# Patient Record
Sex: Male | Born: 1935 | Race: White | Hispanic: No | Marital: Married
Health system: Southern US, Community
[De-identification: ages and names within clinical notes are randomized; demographics above are authoritative.]

## PROBLEM LIST (undated history)

## (undated) DIAGNOSIS — I639 Cerebral infarction, unspecified: Secondary | ICD-10-CM

## (undated) DIAGNOSIS — F32A Depression, unspecified: Secondary | ICD-10-CM

## (undated) DIAGNOSIS — F329 Major depressive disorder, single episode, unspecified: Secondary | ICD-10-CM

## (undated) HISTORY — PX: HERNIA REPAIR: SHX51

## (undated) HISTORY — PX: CHOLECYSTECTOMY: SHX55

---

## 1983-03-18 HISTORY — PX: ANKLE FRACTURE SURGERY: SHX122

## 2016-08-19 ENCOUNTER — Emergency Department (HOSPITAL_COMMUNITY): Payer: Medicare Other

## 2016-08-19 ENCOUNTER — Inpatient Hospital Stay (HOSPITAL_COMMUNITY)
Admission: EM | Admit: 2016-08-19 | Discharge: 2016-08-20 | DRG: 065 | Disposition: A | Discharge status: Home / Self Care | Payer: Medicare Other | Attending: Student in an Organized Health Care Education/Training Program | Admitting: Student in an Organized Health Care Education/Training Program

## 2016-08-19 ENCOUNTER — Observation Stay (HOSPITAL_COMMUNITY): Payer: Medicare Other

## 2016-08-19 ENCOUNTER — Inpatient Hospital Stay (HOSPITAL_COMMUNITY): Payer: Medicare Other

## 2016-08-19 ENCOUNTER — Encounter (HOSPITAL_COMMUNITY): Payer: Self-pay | Admitting: Emergency Medicine

## 2016-08-19 DIAGNOSIS — R29702 NIHSS score 2: Secondary | ICD-10-CM | POA: Diagnosis present

## 2016-08-19 DIAGNOSIS — I480 Paroxysmal atrial fibrillation: Secondary | ICD-10-CM | POA: Diagnosis present

## 2016-08-19 DIAGNOSIS — Z79899 Other long term (current) drug therapy: Secondary | ICD-10-CM | POA: Diagnosis not present

## 2016-08-19 DIAGNOSIS — E785 Hyperlipidemia, unspecified: Secondary | ICD-10-CM

## 2016-08-19 DIAGNOSIS — I63411 Cerebral infarction due to embolism of right middle cerebral artery: Principal | ICD-10-CM | POA: Diagnosis present

## 2016-08-19 DIAGNOSIS — Z7982 Long term (current) use of aspirin: Secondary | ICD-10-CM | POA: Diagnosis not present

## 2016-08-19 DIAGNOSIS — G8194 Hemiplegia, unspecified affecting left nondominant side: Secondary | ICD-10-CM | POA: Diagnosis present

## 2016-08-19 DIAGNOSIS — I63511 Cerebral infarction due to unspecified occlusion or stenosis of right middle cerebral artery: Secondary | ICD-10-CM | POA: Diagnosis not present

## 2016-08-19 DIAGNOSIS — I639 Cerebral infarction, unspecified: Secondary | ICD-10-CM | POA: Diagnosis present

## 2016-08-19 DIAGNOSIS — G458 Other transient cerebral ischemic attacks and related syndromes: Secondary | ICD-10-CM

## 2016-08-19 DIAGNOSIS — Z823 Family history of stroke: Secondary | ICD-10-CM | POA: Diagnosis not present

## 2016-08-19 DIAGNOSIS — F329 Major depressive disorder, single episode, unspecified: Secondary | ICD-10-CM | POA: Diagnosis present

## 2016-08-19 DIAGNOSIS — G459 Transient cerebral ischemic attack, unspecified: Secondary | ICD-10-CM

## 2016-08-19 DIAGNOSIS — R531 Weakness: Secondary | ICD-10-CM | POA: Diagnosis present

## 2016-08-19 DIAGNOSIS — Z9049 Acquired absence of other specified parts of digestive tract: Secondary | ICD-10-CM

## 2016-08-19 DIAGNOSIS — F32A Depression, unspecified: Secondary | ICD-10-CM | POA: Diagnosis present

## 2016-08-19 HISTORY — DX: Major depressive disorder, single episode, unspecified: F32.9

## 2016-08-19 HISTORY — DX: Cerebral infarction, unspecified: I63.9

## 2016-08-19 HISTORY — DX: Depression, unspecified: F32.A

## 2016-08-19 LAB — COMPREHENSIVE METABOLIC PANEL
ALBUMIN: 3.4 g/dL — AB (ref 3.5–5.0)
ALT: 17 U/L (ref 17–63)
ANION GAP: 11 (ref 5–15)
AST: 20 U/L (ref 15–41)
Alkaline Phosphatase: 70 U/L (ref 38–126)
BUN: 21 mg/dL — ABNORMAL HIGH (ref 6–20)
CHLORIDE: 104 mmol/L (ref 101–111)
CO2: 23 mmol/L (ref 22–32)
Calcium: 8.7 mg/dL — ABNORMAL LOW (ref 8.9–10.3)
Creatinine, Ser: 1.16 mg/dL (ref 0.61–1.24)
GFR calc non Af Amer: 58 mL/min — ABNORMAL LOW (ref >60)
GLUCOSE: 135 mg/dL — AB (ref 65–99)
POTASSIUM: 4 mmol/L (ref 3.5–5.1)
SODIUM: 138 mmol/L (ref 135–145)
Total Bilirubin: 0.7 mg/dL (ref 0.3–1.2)
Total Protein: 6.7 g/dL (ref 6.5–8.1)

## 2016-08-19 LAB — I-STAT TROPONIN, ED: Troponin i, poc: 0 ng/mL (ref 0.00–0.08)

## 2016-08-19 LAB — I-STAT CHEM 8, ED
BUN: 23 mg/dL — AB (ref 6–20)
CHLORIDE: 104 mmol/L (ref 101–111)
Calcium, Ion: 1.13 mmol/L — ABNORMAL LOW (ref 1.15–1.40)
Creatinine, Ser: 1.2 mg/dL (ref 0.61–1.24)
Glucose, Bld: 132 mg/dL — ABNORMAL HIGH (ref 65–99)
HEMATOCRIT: 43 % (ref 39.0–52.0)
Hemoglobin: 14.6 g/dL (ref 13.0–17.0)
Potassium: 3.9 mmol/L (ref 3.5–5.1)
Sodium: 141 mmol/L (ref 135–145)
TCO2: 25 mmol/L (ref 0–100)

## 2016-08-19 LAB — CBC
HCT: 42.7 % (ref 39.0–52.0)
Hemoglobin: 14.3 g/dL (ref 13.0–17.0)
MCH: 30.9 pg (ref 26.0–34.0)
MCHC: 33.5 g/dL (ref 30.0–36.0)
MCV: 92.2 fL (ref 78.0–100.0)
PLATELETS: 208 10*3/uL (ref 150–400)
RBC: 4.63 MIL/uL (ref 4.22–5.81)
RDW: 13.2 % (ref 11.5–15.5)
WBC: 8.6 10*3/uL (ref 4.0–10.5)

## 2016-08-19 LAB — DIFFERENTIAL
BASOS PCT: 0 %
Basophils Absolute: 0 10*3/uL (ref 0.0–0.1)
EOS ABS: 0.2 10*3/uL (ref 0.0–0.7)
Eosinophils Relative: 2 %
Lymphocytes Relative: 33 %
Lymphs Abs: 2.9 10*3/uL (ref 0.7–4.0)
Monocytes Absolute: 0.7 10*3/uL (ref 0.1–1.0)
Monocytes Relative: 8 %
NEUTROS PCT: 57 %
Neutro Abs: 4.9 10*3/uL (ref 1.7–7.7)

## 2016-08-19 LAB — PROTIME-INR
INR: 1.13
PROTHROMBIN TIME: 14.6 s (ref 11.4–15.2)

## 2016-08-19 LAB — ECHOCARDIOGRAM COMPLETE: WEIGHTICAEL: 3724.89 [oz_av]

## 2016-08-19 LAB — APTT: aPTT: 27 seconds (ref 24–36)

## 2016-08-19 MED ORDER — ACETAMINOPHEN 650 MG RE SUPP
650.0000 mg | RECTAL | Status: DC | PRN
Start: 2016-08-19 — End: 2016-08-20

## 2016-08-19 MED ORDER — ENOXAPARIN SODIUM 40 MG/0.4ML ~~LOC~~ SOLN
40.0000 mg | SUBCUTANEOUS | Status: DC
  Administered 2016-08-19 – 2016-08-20 (×2): 40 mg via SUBCUTANEOUS
  Filled 2016-08-19 (×2): qty 0.4

## 2016-08-19 MED ORDER — STROKE: EARLY STAGES OF RECOVERY BOOK
Freq: Once | Status: AC
  Administered 2016-08-19: 12:00:00

## 2016-08-19 MED ORDER — ACETAMINOPHEN 160 MG/5ML PO SOLN
650.0000 mg | ORAL | Status: DC | PRN
Start: 2016-08-19 — End: 2016-08-20

## 2016-08-19 MED ORDER — CITALOPRAM HYDROBROMIDE 10 MG PO TABS
10.0000 mg | ORAL_TABLET | Freq: Every day | ORAL | Status: DC
  Administered 2016-08-19 – 2016-08-20 (×2): 10 mg via ORAL
  Filled 2016-08-19 (×2): qty 1

## 2016-08-19 MED ORDER — ATORVASTATIN CALCIUM 40 MG PO TABS
40.0000 mg | ORAL_TABLET | Freq: Every day | ORAL | Status: DC
  Administered 2016-08-19: 40 mg via ORAL
  Filled 2016-08-19: qty 1

## 2016-08-19 MED ORDER — ASPIRIN EC 325 MG PO TBEC
325.0000 mg | DELAYED_RELEASE_TABLET | Freq: Every day | ORAL | Status: DC
  Administered 2016-08-19 – 2016-08-20 (×2): 325 mg via ORAL
  Filled 2016-08-19 (×2): qty 1

## 2016-08-19 MED ORDER — VITAMIN B-12 1000 MCG PO TABS
1000.0000 ug | ORAL_TABLET | Freq: Every day | ORAL | Status: DC
  Administered 2016-08-19 – 2016-08-20 (×2): 1000 ug via ORAL
  Filled 2016-08-19 (×2): qty 1

## 2016-08-19 MED ORDER — ACETAMINOPHEN 325 MG PO TABS: 650.0000 mg | ORAL_TABLET | ORAL | Status: DC | PRN

## 2016-08-19 NOTE — Progress Notes (Signed)
Patient admitted to 205m15. Patient is alert, oriented x4, NIHHS 0, skin is intact, Q2 vitals started. Patient c/o 2/10 headache, refuses medication at this time.

## 2016-08-19 NOTE — Discharge Planning (Signed)
[facility] Alda LeaKelly Ritchie, Consulting civil engineerTranfer Coordinator [facility].  [facility] to [facility] with [facility]. EDCM handed off VA transfer paperwork to 57M CM, Fabio NeighborsKelli Willard.

## 2016-08-19 NOTE — ED Triage Notes (Addendum)
Per EMS, pt from home with c/o BLE weakness. LKW at 0100, pt attempted to get up at 0500 and was unable to stand. Pt stated he did not fall, pt also states he felt like his speech was different, speech clear at this time. VSS

## 2016-08-19 NOTE — Consult Note (Signed)
Neurology Consultation Reason for Consult: Difficulty walking Referring Physician: Bebe Shaggy, D  CC: Weakness  History is obtained from: Patient, wife  HPI: Adonus Uselman is a 83 y.o. male who presents with difficulty walking. He states that he went to go get out of bed and just was unable to arise. He got numb sometime overnight in the bathroom, sometime between 1 and 2 AM. He is not sure of the time. His wife states that she is also on not certain, but it was probably "between 1 and 2 AM." He then awoke around 5 and tried to get out of bed. He was unable to, feeling just generally weak. [facility] apartment was called to found him half and then half out of bed and EMS was called and transported him as a code stroke.   LKW: 1 AM tpa given?: no, out of window   ROS: A 14 point ROS was performed and is negative except as noted in the HPI.   Past medical history: Depression(on Celexa)  Family history: No history of stroke  Social History:  reports that he is a non-smoker but has been exposed to tobacco smoke. He has never used smokeless tobacco. He reports that he does not drink alcohol or use drugs.   Exam: Current vital signs: BP (!) 154/82 (BP Location: Right Arm)   Pulse 68   Temp 97.5 F (36.4 C) (Oral)   Resp 15   Wt 105.6 kg (232 lb 12.9 oz)   SpO2 96%  Vital signs in last 24 hours: Temp:  [97.5 F (36.4 C)] 97.5 F (36.4 C) (06/05 0603) Pulse Rate:  [68] 68 (06/05 0603) Resp:  [15] 15 (06/05 0603) BP: (154)/(82) 154/82 (06/05 0603) SpO2:  [96 %] 96 % (06/05 0603) Weight:  [105.6 kg (232 lb 12.9 oz)] 105.6 kg (232 lb 12.9 oz) (06/05 0603)   Physical Exam  Constitutional: Appears well-developed and well-nourished.  Psych: Affect appropriate to situation Eyes: No scleral injection HENT: No OP obstrucion Head: Normocephalic.  Cardiovascular: Normal rate and regular rhythm.  Respiratory: Effort normal and breath sounds normal to anterior ascultation GI: Soft.  No  distension. There is no tenderness.  Skin: WDI  Neuro: Mental Status: Patient is awake, alert, oriented to person, place, month, year, and situation. Patient is able to give a clear and coherent history. No signs of aphasia. He has clear extinction to double simultaneous stimulation to light touch, but not to visual stimuli Cranial Nerves: II: Visual Fields are full. Pupils are equal, round, and reactive to light.   III,IV, VI: EOMI without ptosis or diploplia.  V: Facial sensation is symmetric to temperature VII: Facial movement is symmetric.  VIII: hearing is intact to voice X: Uvula elevates symmetrically XI: Shoulder shrug is symmetric. XII: tongue is midline without atrophy or fasciculations.  Motor: Tone is normal. Bulk is normal. 5/5 strength was present in all four extremities.  Sensory: He is unable to feel touch in the distal left leg, the proximal left leg he is able to identify touch, but with double simultaneous stimulation in both the leg, arm, and face he extinguishes. Cerebellar: FNF and HKS are intact bilaterally   I have reviewed labs in epic and the results pertinent to this consultation are: CMP-unremarkable  I have reviewed the images obtained: CT head-unremarkable  Impression: 83 year old male with new onset neglect. I suspect that he may have had more severe weakness earlier but was unaware of laterality due to his neglect. Unfortunately, he is outside of  the IV TPA window, but further evaluation is needed.  Recommendations: 1. HgbA1c, fasting lipid panel 2. MRI, MRA  of the brain without contrast 3. Frequent neuro checks 4. Echocardiogram 5. Carotid dopplers 6. Prophylactic therapy-Antiplatelet med: Aspirin - dose 325mg  PO or 300mg  PR 7. Risk factor modification 8. Telemetry monitoring 9. PT consult, OT consult, Speech consult 10. please page stroke NP  Or  PA  Or MD  from 8am -4 pm as this patient will be followed by the stroke team at this point.    You can look them up on [email or web address]      Ritta SlotMcNeill Kahle Mcqueen, MD [facility] [phone]  If 7pm- 7am, please page neurology on call as listed in AMION.

## 2016-08-19 NOTE — Evaluation (Signed)
Physical Therapy Evaluation Patient Details Name: Robert Mendoza MRN: [id number] DOB: 08-29-1933 Today's Date: 08/19/2016   History of Present Illness  Patient is an 83 yo male admitted 08/19/16 with Lt-sided weakness, LT neglect, decreased ambulation.  Per chart, patient with quick improvements.  MRI report shows Rt MCA distribution infarct.     PMH:  PAF, cholecystectomy, depression   Clinical Impression  Patient is functioning at supervision to Mod I level with mobility and gait.  Patient reports he is at baseline with gait.  No further acute PT needs identified - PT will sign off.    Follow Up Recommendations No PT follow up;Supervision for mobility/OOB    Equipment Recommendations  None recommended by PT    Recommendations for Other Services       Precautions / Restrictions Precautions Precautions: None Restrictions Weight Bearing Restrictions: No      Mobility  Bed Mobility Overal bed mobility: Modified Independent                Transfers Overall transfer level: Modified independent Equipment used: None                Ambulation/Gait Ambulation/Gait assistance: Supervision Ambulation Distance (Feet): 180 Feet Assistive device: None Gait Pattern/deviations: Step-through pattern;Decreased stride length;Wide base of support     General Gait Details: Patient ambulates with no assistive device with good balance.  Wide base of support - knee instability.  No loss of balance during gait.  Stairs            Wheelchair Mobility    Modified Rankin (Stroke Patients Only) Modified Rankin (Stroke Patients Only) Pre-Morbid Rankin Score: No symptoms Modified Rankin: No significant disability     Balance Overall balance assessment: Needs assistance         Standing balance support: No upper extremity supported Standing balance-Leahy Scale: Good                               Pertinent Vitals/Pain Pain Assessment: 0-10 Pain Score: 3   Pain Location: Head Pain Descriptors / Indicators: Headache;Dull Pain Intervention(s): Monitored during session    Home Living Family/patient expects to be discharged to:: Private residence Living Arrangements: Spouse/significant other Available Help at Discharge: Family;Available 24 hours/day (Supervision assist only) Type of Home: House Home Access: Level entry     Home Layout: One level Home Equipment: Cane - single point;Shower seat Additional Comments: Wife is having trouble with Lt leg/hip and can provide supervision assist.    Prior Function Level of Independence: Independent         Comments: Patient worked in his garden, drives     Higher education careers adviser   Dominant Hand: Right    Extremity/Trunk Assessment   Upper Extremity Assessment Upper Extremity Assessment: Defer to OT evaluation    Lower Extremity Assessment Lower Extremity Assessment: Overall WFL for tasks assessed       Communication   Communication: No difficulties  Cognition Arousal/Alertness: Awake/alert Behavior During Therapy: WFL for tasks assessed/performed Overall Cognitive Status: Within Functional Limits for tasks assessed                                        General Comments      Exercises     Assessment/Plan    PT Assessment Patent does not need any further PT services  PT  Problem List         PT Treatment Interventions      PT Goals (Current goals can be found in the Care Plan section)  Acute Rehab PT Goals PT Goal Formulation: All assessment and education complete, DC therapy    Frequency     Barriers to discharge        Co-evaluation               AM-PAC PT "6 Clicks" Daily Activity  Outcome Measure Difficulty turning over in bed (including adjusting bedclothes, sheets and blankets)?: None Difficulty moving from lying on back to sitting on the side of the bed? : None Difficulty sitting down on and standing up from a chair with arms (e.g.,  wheelchair, bedside commode, etc,.)?: None Help needed moving to and from a bed to chair (including a wheelchair)?: None Help needed walking in hospital room?: A Little Help needed climbing 3-5 steps with a railing? : A Little 6 Click Score: 22    End of Session Equipment Utilized During Treatment: Gait belt Activity Tolerance: Patient tolerated treatment well Patient left: in bed;with call bell/phone within reach;with family/visitor present (sitting EOB for dinner) Nurse Communication: Mobility status (No PT needs, will sign off. Encouraged ambulation) PT Visit Diagnosis: Other abnormalities of gait and mobility (R26.89);Pain Pain - part of body:  (Headache)    Time: [id number] PT Time Calculation (min) (ACUTE ONLY): 25 min   Charges:   PT Evaluation $PT Eval Moderate Complexity: 1 Procedure PT Treatments $Gait Training: 8-22 mins   PT G Codes:        Durenda HurtSusan H. Renaldo Fiddleravis, PT, [facility] Acute Rehab Services Pager [phone]   Vena AustriaSusan H Maudene Stotler 08/19/2016, 8:35 PM

## 2016-08-19 NOTE — ED Notes (Signed)
Patient transported to MRI 

## 2016-08-19 NOTE — H&P (Signed)
Date: 08/19/2016               Patient Name:  Robert Mendoza MRN: [id number]  DOB: 1934-01-01 Age / Sex: 83 y.o., male   PCP: Robert Apley, MD         Medical Service: Internal Medicine Teaching Service         Attending Physician: Dr. Oswaldo Mendoza, Robert Mendoza, *    First Contact: Dr. Ladona Mendoza Pager: [phone]  Second Contact: Dr. Allena Mendoza Pager: [phone]       After Hours (After 5p/  First Contact Pager: [phone]  weekends / holidays): Second Contact Pager: [phone]   Chief Complaint: difficulty speaking, getting out of bed  History of Present Illness: Robert Mendoza is an 83 year old man with h/o cholecystectomy, depression who presented to the ED this morning with difficulty speaking and getting out of bed. His wife Robert Mendoza was at bedside and contributed to the interview.  When he awoke around 5AM, his wife noted he had trouble speaking and difficulty getting out of bed. Though he didn't fall, he was slumped in his bed, so she called EMS. His deficits improved in the ED though per EMS he had some sensory deficits. He last felt normal around midnight. Though he had a history of recurrent syncopal episodes prior to his cholecystectomy in July 2017, he recalls this morning's events. He denied any associated numbness, changes in vision, dizziness, falls, prior history of similar symptoms or TIA/CVA. [facility] and has seen a cardiologist before and after his surgery and was told he didn't have diabetes or high cholesterol. His sister had a stroke last year at the age of 75 which resulted in difficulty performing basic ADLs and now resides in an assisted living facility. At baseline, he is independent in all of his ADLs and performs garden work without difficulty. He denies any history of tobacco or illicit drug use though drinks at most 1 beer/month.  In the ED, head CT was without hemorrhage. Neurology was consulted and recommended admission for workup of TIA/CVA.   Meds:  Current Meds    Medication Sig  . aspirin EC 81 MG tablet Take 81 mg by mouth daily.  . citalopram (CELEXA) 20 MG tablet Take 10 mg by mouth daily.  . Cyanocobalamin (VITAMIN B-12 PO) Take 1 tablet by mouth daily.    Allergies: Allergies as of 08/19/2016  . (No Known Allergies)   History reviewed. No pertinent past medical history.  Family History: As noted in the HPI.  Social History: Served in the [employer] for 12 years and was stationed in [place], [place], and [place]. Worked as a Chartered certified accountant for 40 years.  Review of Systems: A complete ROS was negative except as per HPI.   Physical Exam: Blood pressure (!) 138/96, pulse (!) 58, temperature 97.5 F (36.4 C), temperature source Oral, resp. rate (!) 9, weight 232 lb 12.9 oz (105.6 kg), SpO2 97 %. Physical Exam  Constitutional: He is oriented to person, place, and time. No distress.  HENT:  Head: Normocephalic and atraumatic.  Eyes: Conjunctivae are normal. No scleral icterus.  Neck: Normal range of motion. No tracheal deviation present.  Cardiovascular: Normal rate, normal heart sounds and intact distal pulses.   Pulmonary/Chest: Effort normal. No respiratory distress.  Neurological: He is alert and oriented to person, place, and time. No cranial nerve deficit. Coordination normal.  2+ patellar reflexes bilaterally. Babinski unequivocal bilaterally.  Skin: Skin is warm and dry. He is not  diaphoretic.   EKG: I reviewed and noted normal sinus rhythm and normal axis.  -PR interval is 0.12s which is within normal limits.   Assessment & Plan by Problem: Principal Problem:   TIA (transient ischemic attack) Active Problems:   S/P cholecystectomy   Depression  Mr. Leane Mendoza is an 83 year old man with h/o cholecystectomy, depression hospitalized for TIA/CVA.  TIA/CVA: No focal deficits on exam but age and presentation are concerning for TIA or ischemic. Other possibilities include syncope though he recalls the events completely or hypoglycemia  but is not on any hypoglycemic agents with normal glucose on admission labwork.  -Admit to observation with telemetry -Check echo, carotid dopplers, A1c, lipid panel -Continue neuro checks q2h then q4h -Follow-up brain MRI/MRA -Consult PT/OT/SLP -Neurology following, appreciate recs -Give aspirin 325 mg though plan to switch to clopidogrel if he does have CVA since he was on daily aspirin beforehand  Depression: Continue citalopram 10 mg pending swallow eval.  #FEN:  -Diet: NPO pending bedside swallow  #DVT prophylaxis: Lovenox  #CODE STATUS: FULL CODE -Defer to wife Brain HiltsLisa Mendoza [phone]] if patients lacks decision-making capacity -Confirmed with patient on admission  Dispo: Admit patient to Observation with expected length of stay less than 2 midnights.  Signed: Beather Mendoza, Robert V, MD 08/19/2016, 8:55 AM  Pager: [phone]

## 2016-08-19 NOTE — ED Provider Notes (Signed)
D/w internal medicine service Will admit They will [facility]w [facility] if patient can be managed here or will need [facility] - [phone] Dr Mortimer FriesLiliana    Devi Hopman, MD 08/19/16 [id number]

## 2016-08-19 NOTE — ED Provider Notes (Signed)
[facility]-EMERGENCY DEPT Provider Note   CSN: [id number] Arrival date & time: 08/19/16  0544   An emergency department physician performed an initial assessment on this suspected stroke patient at 0546.  History   Chief Complaint Chief Complaint  Patient presents with  . Code Stroke   LEVEL 5 CAVEAT DUE TO ACUITY OF CONDITION  HPI Anastacio Bua is a 83 y.o. male.  The history is provided by the patient, the spouse and the EMS personnel. The history is limited by the condition of the patient.  Weakness  Primary symptoms include loss of sensation, loss of balance, speech change. This is a new problem. The current episode started 3 to 5 hours ago. The problem has been gradually improving. There has been no fever. Pertinent negatives include no shortness of breath and no chest pain.  pt presents with acute onset of weakness, difficulty speaking and loss of sensation LKW approximately 1am He is now improving No other acute complaints at this time He is a [facility] patient    PMH - unknown  Past Surgical History:  Procedure Laterality Date  . CHOLECYSTECTOMY         Home Medications    Prior to Admission medications   Medication Sig Start Date End Date Taking? Authorizing Provider  aspirin EC 81 MG tablet Take 81 mg by mouth daily.   Yes [provider]  citalopram (CELEXA) 20 MG tablet Take 10 mg by mouth daily.   Yes [provider]  Cyanocobalamin (VITAMIN B-12 PO) Take 1 tablet by mouth daily.   Yes [provider]    Family History No family history on file.  Social History Social History  Substance Use Topics  . Smoking status: Passive Smoke Exposure - Never Smoker  . Smokeless tobacco: Never Used  . Alcohol use No     Allergies   Patient has no known allergies.   Review of Systems Review of Systems  Unable to perform ROS: Acuity of condition  Respiratory: Negative for shortness of breath.   Cardiovascular: Negative for chest pain.    Neurological: Positive for speech change, weakness and loss of balance.     Physical Exam Updated Vital Signs BP (!) 154/82 (BP Location: Right Arm)   Pulse 68   Temp 97.5 F (36.4 C) (Oral)   Resp 15   Wt 105.6 kg (232 lb 12.9 oz)   SpO2 96%   Physical Exam CONSTITUTIONAL: Elderly, no acute distress HEAD: Normocephalic/atraumatic EYES: EOMI ENMT: Mucous membranes moist NECK: supple no meningeal signs SPINE/BACK:entire spine nontender CV: S1/S2 noted, no murmurs/rubs/gallops noted LUNGS: Lungs are clear to auscultation bilaterally, no apparent distress ABDOMEN: soft, nontender GU:no cva tenderness NEURO: Pt is awake/alert/appropriate, moves all extremitiesx4.  No facial droop.  No arm/leg drift. No speech changes.   EXTREMITIES: pulses normal/equal, full ROM SKIN: warm, color normal PSYCH: no abnormalities of mood noted, alert and oriented to situation   ED Treatments / Results  Labs (all labs ordered are listed, but only abnormal results are displayed) Labs Reviewed  COMPREHENSIVE METABOLIC PANEL - Abnormal; Notable for the following:       Result Value   Glucose, Bld 135 (*)    BUN 21 (*)    Calcium 8.7 (*)    Albumin 3.4 (*)    GFR calc non Af Amer 58 (*)    All other components within normal limits  I-STAT CHEM 8, ED - Abnormal; Notable for the following:    BUN 23 (*)  Glucose, Bld 132 (*)    Calcium, Ion 1.13 (*)    All other components within normal limits  PROTIME-INR  APTT  CBC  DIFFERENTIAL  I-STAT TROPOININ, ED  CBG MONITORING, ED    EKG  EKG Interpretation  Date/Time:  Tuesday August 19 2016 06:01:33 EDT Ventricular Rate:  70 PR Interval:    QRS Duration: 97 QT Interval:  421 QTC Calculation: 455 R Axis:   48 Text Interpretation:  Sinus rhythm Prolonged PR interval Confirmed by Zadie RhineWickline, Marjan Rosman ([id number]) on 08/19/2016 6:33:48 AM       Radiology Ct Head Code Stroke W/o Cm  Result Date: 08/19/2016 CLINICAL DATA:  Code stroke. Initial  evaluation for acute left-sided weakness. EXAM: CT HEAD WITHOUT CONTRAST TECHNIQUE: Contiguous axial images were obtained from the base of the skull through the vertex without intravenous contrast. COMPARISON:  None. FINDINGS: Brain: Mild age-related cerebral atrophy. No evidence for acute intracranial hemorrhage. No findings to suggest acute large vessel territory infarct. No mass lesion, midline shift or mass effect. No hydrocephalus. No extra-axial fluid collection. Vascular: Intracranial vascular somewhat diffusely hyperdense. No asymmetric hyperdense vessel identified. Skull: Scalp soft tissues and calvarium within normal limits. Sinuses/Orbits: Globes and orbital soft tissues within normal limits. Scattered mucosal thickening within the maxillary sinuses and ethmoidal air cells bilaterally. Paranasal sinuses are otherwise clear. No mastoid effusion. ASPECTS [facility]([place] Stroke Program Early CT Score) - Ganglionic level infarction (caudate, lentiform nuclei, internal capsule, insula, M1-M3 cortex): 7 - Supraganglionic infarction (M4-M6 cortex): 3 Total score (0-10 with 10 being normal): 10 IMPRESSION: 1. No acute intracranial infarct or other process identified. 2. ASPECTS is 10 Critical Value/emergent results were called by telephone at the time of interpretation on 08/19/2016 at 6:13 am to Dr. Amada JupiterKirkpatrick, who verbally acknowledged these results. Electronically Signed   By: Rise MuBenjamin  McClintock M.D.   On: 08/19/2016 06:17    Procedures Procedures (including critical care time)  Medications Ordered in ED Medications - No data to display   Initial Impression / Assessment and Plan / ED Course  I have reviewed the triage vital signs and the nursing notes.  Pertinent labs & imaging results that were available during my care of the patient were reviewed by me and considered in my medical decision making (see chart for details).     6:37 AM Pt arrived for code stroke LKW at 1am D/w dr Amada Jupiterkirkpatrick, he  reports pt is improving, not a TPA candidate tPA in stroke considered but not given due to: Onset over 3-4.5hours Rapid improvement/severity mild  Currently, pt awake/alert, no focal weakness, no obvious neglect noted  Concern for TIA, EMS had documented sensory deficit as well as neglect that is improving   Final Clinical Impressions(s) / ED Diagnoses   Final diagnoses:  Other specified transient cerebral ischemias    New Prescriptions New Prescriptions   No medications on file     Zadie RhineWickline, Caree Wolpert, MD 08/19/16 [phone]

## 2016-08-19 NOTE — Discharge Planning (Signed)
[facility] Lac[facility] DraftKelly Richie [facility]  [facility] and spouse at bedside who would like to be transferred if possible.  EDCM will continue to call and update as information becomes available.

## 2016-08-19 NOTE — Progress Notes (Signed)
STROKE TEAM PROGRESS NOTE   HISTORY OF PRESENT ILLNESS (per record) Robert Mendoza is a 83 y.o. male who presents with difficulty walking. He states that he went to go get out of bed and just was unable to arise. He got numb sometime overnight in the bathroom, sometime between 1 and 2 AM 08/19/2016. He is not sure of the time. His wife states that she is also on not certain, but it was probably "between 1 and 2 AM." He then awoke around 5 and tried to get out of bed. He was unable to, feeling just generally weak. [facility] apartment was called to found him half and then half out of bed and EMS was called and transported him as a code stroke. Patient was not administered IV t-PA secondary to being out of window. He is to be admitted for further evaluation and treatment.   SUBJECTIVE (INTERVAL HISTORY) Wife and son are at bedside. As per family and Dr. Oswaldo DoneVincent, pt had cholecystectomy last August found to have PAF, was put on eliquis for 2-3 days and stopped due to dizziness and not feeling well. Considered eliquis side effect at that time. Pt since then did not feel palpitation anymore. However, this time MRI showed right MCA infarct.    OBJECTIVE Temp:  [97.5 F (36.4 C)] 97.5 F (36.4 C) (06/05 0603) Pulse Rate:  [57-68] 57 (06/05 0915) Cardiac Rhythm: Normal sinus rhythm (06/05 0728) Resp:  [9-25] 20 (06/05 0915) BP: (127-154)/(67-96) 138/82 (06/05 0915) SpO2:  [96 %-100 %] 97 % (06/05 0915) Weight:  [105.6 kg (232 lb 12.9 oz)] 105.6 kg (232 lb 12.9 oz) (06/05 0603)  CBC:  Recent Labs Lab 08/19/16 0548 08/19/16 0553  WBC 8.6  --   NEUTROABS 4.9  --   HGB 14.3 14.6  HCT 42.7 43.0  MCV 92.2  --   PLT 208  --     Basic Metabolic Panel:  Recent Labs Lab 08/19/16 0548 08/19/16 0553  NA 138 141  K 4.0 3.9  CL 104 104  CO2 23  --   GLUCOSE 135* 132*  BUN 21* 23*  CREATININE 1.16 1.20  CALCIUM 8.7*  --     Lipid Panel: No results found for: CHOL, TRIG, HDL, CHOLHDL, VLDL,  LDLCALC HgbA1c: No results found for: HGBA1C Urine Drug Screen: No results found for: LABOPIA, COCAINSCRNUR, LABBENZ, AMPHETMU, THCU, LABBARB  Alcohol Level No results found for: [facility]  IMAGING I have personally reviewed the radiological images below and agree with the radiology interpretations.  Ct Head Code Stroke W/o Cm 08/19/2016 1. No acute intracranial infarct or other process identified. 2. ASPECTS is 10   Mri and Mra Brain Wo Contrast 08/19/2016 IMPRESSION: 1. Acute right MCA territory infarct with minimal cytotoxic edema and no hemorrhage or mass effect. 2. No associated large vessel occlusion or right MCA M2 stenosis but a focus of right M3 branch thrombus and occlusion is suspected. 3. Otherwise normal for age intracranial MRI and MRA.   TTE - Left ventricle: The cavity size was normal. Wall thickness was   increased in a pattern of mild LVH. Systolic function was normal.   The estimated ejection fraction was in the range of 60% to 65%.   Wall motion was normal; there were no regional wall motion   abnormalities. Doppler parameters are consistent with abnormal   left ventricular relaxation (grade 1 diastolic dysfunction). - Aortic valve: Trileaflet; mildly thickened, mildly calcified   leaflets. - Aorta: Aortic root dimension: 40 mm (ED). -  Ascending aorta: The ascending aorta was mildly dilated. - Mitral valve: Valve area by pressure half-time: 2.42 cm^2. - Left atrium: The atrium was mildly dilated. - Right ventricle: The cavity size was mildly dilated. Wall   thickness was normal. - Right atrium: The atrium was mildly dilated. - Tricuspid valve: There was trivial regurgitation. Impressions: - No cardiac source of emboli was indentified.  CUS - pending   PHYSICAL EXAM  Temp:  [97.5 F (36.4 C)-98.5 F (36.9 C)] 98.2 F (36.8 C) (06/05 1511) Pulse Rate:  [57-73] 73 (06/05 1511) Resp:  [9-25] 15 (06/05 1511) BP: (122-174)/(62-96) 122/62 (06/05 1511) SpO2:  [96  %-100 %] 98 % (06/05 1511) Weight:  [232 lb 12.9 oz (105.6 kg)] 232 lb 12.9 oz (105.6 kg) (06/05 0603)  General - Well nourished, well developed, in no apparent distress.  Ophthalmologic - Fundi not visualized due to eye movement.  Cardiovascular - Regular rate and rhythm.  Mental Status -  Level of arousal and orientation to time, place, and person were intact. Language including expression, naming, repetition, comprehension was assessed and found intact.  Cranial Nerves II - XII - II - Visual field intact OU. III, IV, VI - Extraocular movements intact. V - Facial sensation intact bilaterally. VII - Facial movement intact bilaterally. VIII - Hearing & vestibular intact bilaterally. X - Palate elevates symmetrically. XI - Chin turning & shoulder shrug intact bilaterally. XII - Tongue protrusion intact.  Motor Strength - The patient's strength was normal in all extremities and pronator drift was absent.  Bulk was normal and fasciculations were absent.   Motor Tone - Muscle tone was assessed at the neck and appendages and was normal.  Reflexes - The patient's reflexes were 1+ in all extremities and he had no pathological reflexes.  Sensory - Light touch, temperature/pinprick were symmetrical.    Coordination - The patient had normal movements in the hands with no ataxia or dysmetria.  Tremor was absent.  Gait and Station - deferred   ASSESSMENT/PLAN Mr. Robert Mendoza is a 83 y.o. male with history of cholecystecomy and depression presenting with complaint of "numbness" unable to get OOB, difficulty speaking. He did not receive IV t-PA due to being out of the window.   Stroke:  right MCA infarct embolic most likely secondary to PAF  Resultant  Deficit resolved  CT head no acute finding. Aspects 10  MRI head right MCA infarct  MRA head likely right M3 occlusion  Carotid Doppler  pending  2D Echo  EF 60-65%  LDL pending  HgbA1c pending  Lovenox for VTE  prophylaxis  Diet NPO time specified  aspirin 81 mg daily prior to admission, now on aspirin 325 mg daily. Recommend to consider Xarelto or pradaxa for stroke prevention in 5-7 days due to moderate sized infarct to avoid hemorrhagic transformation. Once Xarelto or pradaxa starts, ASA can be discontinued.  Patient counseled to be compliant with his antithrombotic medications  Ongoing aggressive stroke risk factor management  Therapy recommendations:  pending  Disposition:  Pending  PAF  Found in 10/2015 during cholecystectomy   Was on eliquis briefly but stopped due to presumed side effect of dizziness  Recommend to try Xarelto or pradaxa for stroke prevention in 5-7 days   Other Stroke Risk Factors  Advanced age  ETOH 1 beer/month  Family Hx stroke (sister)  Other Active Problems  Depression  Hospital day # 0  Marvel Plan, MD PhD Stroke Neurology 08/19/2016 5:24 PM   To contact Stroke  Continuity provider, please refer to [email or web address] After hours, contact General Neurology

## 2016-08-19 NOTE — Code Documentation (Signed)
Responded to Code Stroke called at 0531.  Pt arrived via EMS @0545  with c/o BLE weakness, inability to stand, and difficulty speaking.  CBG-135, SBP-140s.  Pt LSN-0100 when he got up to go to the bathroom. He then awoke at 0500 and had difficulty getting OOB.  NIH on arrival was 2 for sensory and neglect.  Pts speech was clear. CT head negative for acute changes. TPA not given d/t pt out of window.    Plan to admit to medical team.

## 2016-08-20 ENCOUNTER — Inpatient Hospital Stay (HOSPITAL_COMMUNITY): Payer: Medicare Other

## 2016-08-20 DIAGNOSIS — E785 Hyperlipidemia, unspecified: Secondary | ICD-10-CM

## 2016-08-20 DIAGNOSIS — I63511 Cerebral infarction due to unspecified occlusion or stenosis of right middle cerebral artery: Secondary | ICD-10-CM

## 2016-08-20 LAB — LIPID PANEL
CHOLESTEROL: 120 mg/dL (ref 0–200)
HDL: 31 mg/dL — ABNORMAL LOW (ref >40)
LDL Cholesterol: 75 mg/dL (ref 0–99)
Total CHOL/HDL Ratio: 3.9 RATIO
Triglycerides: 68 mg/dL (ref <150)
VLDL: 14 mg/dL (ref 0–40)

## 2016-08-20 LAB — VAS US CAROTID
LCCADDIAS: -11 cm/s
LEFT VERTEBRAL DIAS: 7 cm/s
LICADDIAS: -26 cm/s
LICADSYS: -98 cm/s
LICAPDIAS: -12 cm/s
LICAPSYS: -72 cm/s
Left CCA dist sys: -63 cm/s
Left CCA prox dias: -23 cm/s
Left CCA prox sys: -152 cm/s
RCCAPDIAS: 12 cm/s
RIGHT ECA DIAS: -7 cm/s
RIGHT VERTEBRAL DIAS: 14 cm/s
Right CCA prox sys: 84 cm/s
Right cca dist sys: -61 cm/s

## 2016-08-20 MED ORDER — RIVAROXABAN 20 MG PO TABS: 20.0000 mg | ORAL_TABLET | Freq: Every day | ORAL | 1 refills | Status: AC

## 2016-08-20 MED ORDER — ASPIRIN 325 MG PO TBEC: 325.0000 mg | DELAYED_RELEASE_TABLET | Freq: Every day | ORAL | 0 refills | Status: AC

## 2016-08-20 MED ORDER — PRAVASTATIN SODIUM 20 MG PO TABS: 20.0000 mg | ORAL_TABLET | Freq: Every day | ORAL | Status: DC

## 2016-08-20 NOTE — Progress Notes (Signed)
RN discussed discharge paperwork with patient and family. They verbalized understanding of f/u appts, medical records request requested by family was faxed, given prescriptions, given dc instructions. Ivs removed, tele removed, ccmd notified. Neuro assessment unchanged, NIHHS 0.

## 2016-08-20 NOTE — Care Management Note (Signed)
Case Management Note  Patient Details  Name: Robert Mendoza MRN: [id number] Date of Birth: 04/14/1933  Subjective/Objective:                    Action/Plan: Pt discharging home with self care. No f/u per PT/OT and no DME needs. Pt started on Xarelto this admission. Pt provided 30 day free coupon. Pt has hospital f/u and insurance.  Family transporting patient home.   Expected Discharge Date:  08/20/16               Expected Discharge Plan:  Home/Self Care  In-House Referral:     Discharge planning Services  CM Consult  Post Acute Care Choice:    Choice offered to:     DME Arranged:    DME Agency:     HH Arranged:    HH Agency:     Status of Service:  Completed, signed off  If discussed at [employer] Length of Stay Meetings, dates discussed:    Additional Comments:  Kermit BaloKelli F Alexis Mizuno, RN 08/20/2016, 12:38 PM

## 2016-08-20 NOTE — Progress Notes (Signed)
*  PRELIMINARY RESULTS* Vascular Ultrasound Carotid Duplex (Doppler) has been completed.  Preliminary findings: Bilateral: No significant (1-39%) ICA stenosis. Antegrade vertebral flow.     Farrel DemarkJill Eunice, RDMS, RVT  08/20/2016, 10:04 AM

## 2016-08-20 NOTE — Discharge Instructions (Signed)
Rivaroxaban oral tablets °What is this medicine? °RIVAROXABAN (ri va ROX a ban) is an anticoagulant (blood thinner). It is used to treat blood clots in the lungs or in the veins. It is also used after knee or hip surgeries to prevent blood clots. It is also used to lower the chance of stroke in people with a medical condition called atrial fibrillation. °This medicine may be used for other purposes; ask your health care provider or pharmacist if you have questions. °COMMON BRAND NAME(S): Xarelto, Xarelto Starter Pack °What should I tell my health care provider before I take this medicine? °They need to know if you have any of these conditions: °-bleeding disorders °-bleeding in the brain °-blood in your stools (black or tarry stools) or if you have blood in your vomit °-history of stomach bleeding °-kidney disease °-liver disease °-low blood counts, like low white cell, platelet, or red cell counts °-recent or planned spinal or epidural procedure °-take medicines that treat or prevent blood clots °-an unusual or allergic reaction to rivaroxaban, other medicines, foods, dyes, or preservatives °-pregnant or trying to get pregnant °-breast-feeding °How should I use this medicine? °Take this medicine by mouth with a glass of water. Follow the directions on the prescription label. Take your medicine at regular intervals. Do not take it more often than directed. Do not stop taking except on your doctor's advice. Stopping this medicine may increase your risk of a blood clot. Be sure to refill your prescription before you run out of medicine. °If you are taking this medicine after hip or knee replacement surgery, take it with or without food. If you are taking this medicine for atrial fibrillation, take it with your evening meal. If you are taking this medicine to treat blood clots, take it with food at the same time each day. If you are unable to swallow your tablet, you may crush the tablet and mix it in applesauce. Then,  immediately eat the applesauce. You should eat more food right after you eat the applesauce containing the crushed tablet. °Talk to your pediatrician regarding the use of this medicine in children. Special care may be needed. °Overdosage: If you think you have taken too much of this medicine contact a poison control center or emergency room at once. °NOTE: This medicine is only for you. Do not share this medicine with others. °What if I miss a dose? °If you take your medicine once a day and miss a dose, take the missed dose as soon as you remember. If you take your medicine twice a day and miss a dose, take the missed dose immediately. In this instance, 2 tablets may be taken at the same time. The next day you should take 1 tablet twice a day as directed. °What may interact with this medicine? °Do not take this medicine with any of the following medications: °-defibrotide °This medicine may also interact with the following medications: °-aspirin and aspirin-like medicines °-certain antibiotics like erythromycin, azithromycin, and clarithromycin °-certain medicines for fungal infections like ketoconazole and itraconazole °-certain medicines for irregular heart beat like amiodarone, quinidine, dronedarone °-certain medicines for seizures like carbamazepine, phenytoin °-certain medicines that treat or prevent blood clots like warfarin, enoxaparin, and dalteparin °-conivaptan °-diltiazem °-felodipine °-indinavir °-lopinavir; ritonavir °-NSAIDS, medicines for pain and inflammation, like ibuprofen or naproxen °-ranolazine °-rifampin °-ritonavir °-SNRIs, medicines for depression, like desvenlafaxine, duloxetine, levomilnacipran, venlafaxine °-SSRIs, medicines for depression, like citalopram, escitalopram, fluoxetine, fluvoxamine, paroxetine, sertraline °-St. John's wort °-verapamil °This list may not describe all   possible interactions. Give your health care provider a list of all the medicines, herbs, non-prescription  drugs, or dietary supplements you use. Also tell them if you smoke, drink alcohol, or use illegal drugs. Some items may interact with your medicine. °What should I watch for while using this medicine? °Visit your doctor or health care professional for regular checks on your progress. °Notify your doctor or health care professional and seek emergency treatment if you develop breathing problems; changes in vision; chest pain; severe, sudden headache; pain, swelling, warmth in the leg; trouble speaking; sudden numbness or weakness of the face, arm or leg. These can be signs that your condition has gotten worse. °If you are going to have surgery or other procedure, tell your doctor that you are taking this medicine. °What side effects may I notice from receiving this medicine? °Side effects that you should report to your doctor or health care professional as soon as possible: °-allergic reactions like skin rash, itching or hives, swelling of the face, lips, or tongue °-back pain °-redness, blistering, peeling or loosening of the skin, including inside the mouth °-signs and symptoms of bleeding such as bloody or black, tarry stools; red or dark-brown urine; spitting up blood or brown material that looks like coffee grounds; red spots on the skin; unusual bruising or bleeding from the eye, gums, or nose °Side effects that usually do not require medical attention (report to your doctor or health care professional if they continue or are bothersome): °-dizziness °-muscle pain °This list may not describe all possible side effects. Call your doctor for medical advice about side effects. You may report side effects to FDA at 1-800-FDA-1088. °Where should I keep my medicine? °Keep out of the reach of children. °Store at room temperature between 15 and 30 degrees C (59 and 86 degrees F). Throw away any unused medicine after the expiration date. °NOTE: This sheet is a summary. It may not cover all possible information. If you  have questions about this medicine, talk to your doctor, pharmacist, or health care provider. °© 2018 Elsevier/Gold Standard (2015-11-21 16:29:33) ° °

## 2016-08-20 NOTE — Evaluation (Signed)
Speech Language Pathology Evaluation Patient Details Name: Robert Mendoza MRN: [id number] DOB: 10/04/1933 Today's Date: 08/20/2016 Time: [id number] SLP Time Calculation (min) (ACUTE ONLY): 20 min  Problem List:  Patient Active Problem List   Diagnosis Date Noted  . Hyperlipidemia   . S/P cholecystectomy 08/19/2016  . Depression 08/19/2016  . Arterial ischemic stroke, MCA, right, acute (HCC) 08/19/2016  . Stroke (HCC) 08/19/2016  . Paroxysmal atrial fibrillation [facility](HCC)    Past Medical History:  Past Medical History:  Diagnosis Date  . Depression   . Stroke [facility](HCC)    Past Surgical History:  Past Surgical History:  Procedure Laterality Date  . ANKLE FRACTURE SURGERY Left 1985  . CHOLECYSTECTOMY    . HERNIA REPAIR     HPI:  Patient is an 83 yo male admitted 08/19/16 with Lt-sided weakness, LT neglect, decreased ambulation.  Per chart, patient with quick improvements.  MRI report shows Rt MCA distribution infarct.     PMH:  PAF, cholecystectomy, depression    Assessment / Plan / Recommendation Clinical Impression  Pt scored a 27/30 on the MOCA, indicative of cognitive-linguistic function that is [facility]. He and his family have no subjective complaints. No SLP f/u indicated.    SLP Assessment  SLP Recommendation/Assessment: Patient does not need any further Speech Lanaguage Pathology Services SLP Visit Diagnosis: Cognitive communication deficit (R41.841)    Follow Up Recommendations  None    Frequency and Duration           SLP Evaluation Cognition  Overall Cognitive Status: Within Functional Limits for tasks assessed Orientation Level: Oriented X4       Comprehension  Auditory Comprehension Overall Auditory Comprehension: Appears within functional limits for tasks assessed    Expression Expression Primary Mode of Expression: Verbal Verbal Expression Overall Verbal Expression: Appears within functional limits for tasks assessed Written Expression Dominant Hand: Right    Oral / Motor  Motor Speech Overall Motor Speech: Appears within functional limits for tasks assessed   GO                    Maxcine Hamaiewonsky, Shin Lamour 08/20/2016, 1:28 PM  Maxcine HamLaura Paiewonsky, M.A. CCC-SLP [phone]

## 2016-08-20 NOTE — Discharge Summary (Signed)
Name: Robert Mendoza MRN: [id number] DOB: 13-Dec-1933 83 y.o. PCP: Robert Apley, MD  Date of Admission: 08/19/2016  5:46 AM Date of Discharge: 08/20/2016 Attending Physician: No att. providers found  Discharge Diagnosis: 1.Right  MCA Infarct  Principal Problem:   Arterial ischemic stroke, MCA, right, acute (HCC) Active Problems:   S/P cholecystectomy   Depression   Stroke [facility])   Paroxysmal atrial fibrillation (HCC)   Hyperlipidemia   Discharge Medications: Allergies as of 08/20/2016   No Known Allergies     Medication List    TAKE these medications   aspirin 325 MG EC tablet Take 1 tablet (325 mg total) by mouth daily. Start taking on:  08/21/2016 What changed:  medication strength  how much to take   citalopram 20 MG tablet Commonly known as:  CELEXA Take 10 mg by mouth daily.   rivaroxaban 20 MG Tabs tablet Commonly known as:  XARELTO Take 1 tablet (20 mg total) by mouth daily with supper. Start taking on:  08/26/2016   VITAMIN B-12 PO Take 1 tablet by mouth daily.       Disposition and follow-up:   [name] was discharged from [facility] in Good condition.  At the hospital follow up visit please address:  1.  Please ensure the patient discontinues his aspirin on 08/26/2016 and start Xarelto.  2.  Labs / imaging needed at time of follow-up: Please follow-up on the official read of the carotid doppler, please follow up the patient's hemoglobin A1c and treat if necessary  3.  Pending labs/ test needing follow-up: None  Follow-up Appointments: Follow-up Information    Robert Riggs, NP. Schedule an appointment as soon as possible for a visit in 6 week(s).   Specialty:  Family Medicine Contact information: [address] [place] [address] [phone]           Hospital Course by problem list: Principal Problem:   Arterial ischemic stroke, MCA, right, acute (HCC) Active Problems:   S/P  cholecystectomy   Depression   Stroke [facility])   Paroxysmal atrial fibrillation (HCC)   Hyperlipidemia   1. Right MCA territory infarct  The patient presents to the [facility] emergency department on 08/19/2016 with weakness of his left upper and lower extremity. The patient stated that the weakness started overnight and he was out of the window for IV TPA. The patient had an MRI which demonstrated a large right MCA infarct. This is most likely cardioembolic in nature secondary to untreated atrial fibrillation. MRA without significant stenosis. Carotid Dopplers without significant stenosis. Patient has been in normal sinus rhythm in the inpatient setting. Echocardiogram normal with left ventricular ejection fraction of 60-65%. Lipid panel normal. Hemoglobin A1c not resulted. Patient has no residual stroke deficits. Physical therapy with no recommendations. He is appropriate for discharge. At discharge we will send the patient home on aspirin. In 6 days on Tuesday, 08/26/2016 the patient will discontinue aspirin and start Xarelto 20mg  daily. I think this is the most appropriate choice in the setting of large right MCA territory infarct that is most likely secondary to cardioembolic source in the setting of untreated atrial fibrillation. We will wait one week from the incident event to start anticoagulation secondary to hemorrhagic conversion risk.  Discharge Vitals:   BP 127/66 (BP Location: Left Arm)   Pulse 64   Temp 98.6 F (37 C) (Oral)   Resp 16   Wt 232 lb 12.9 oz (105.6 kg)   SpO2  98%   Pertinent Labs, Studies, and Procedures:  1. CT head-no acute intracranial infarct or other process 2. MRI brain-acute right MCA territory infarct 3. MRA head-acute right MCA territory infarct, no associated large vessel occlusion 4. Carotid Doppler-1-39% ICA stenosis 5. Echocardiogram-left ventricle ejection fraction normal at 60-65% 6. Lipid panel- unremarkable 7. Hemoglobin A1c-Will need  follow-up  Discharge Instructions: Discharge Instructions    Ambulatory referral to Neurology    Complete by:  As directed    Follow up with stroke clinic Robert Mendoza(Carolyn Martin preferred, if not available, then consider Sylvie FarrierXu, Sethi, [facility] or [facility] Gaskinshern whoever is available) [facility] in about 6-8 weeks. Thanks.   Diet - low sodium heart healthy    Complete by:  As directed    Discharge instructions    Complete by:  As directed    You were diagnosed with a right MCA stroke. We think this stroke was cardioembolic in nature meaning that it came from your heart and is caused by your atrial fibrillation. We will discharge you on aspirin 325 mg daily. On 08/26/2016 please stop taking the aspirin and start taking Xarelto. You are to take the Xarelto once daily. A handwritten prescription for this medication will be provided to you.  Please take this paperwork with you to [facility] doctors. If you have any questions please [facility].  I think you have recovered great from your stroke. Please continue to take your medications going forward.   Increase activity slowly    Complete by:  As directed       Signed: Thomasene Lotaylor, Ambyr Qadri, MD 08/20/2016, 1:53 PM   Pager: [phone]

## 2016-08-20 NOTE — Evaluation (Signed)
Occupational Therapy Evaluation and Discharge Patient Details Name: Robert Mendoza MRN: [id number] DOB: 12/13/1933 Today's Date: 08/20/2016    History of Present Illness Patient is an 83 yo male admitted 08/19/16 with Lt-sided weakness, LT neglect, decreased ambulation.  Per chart, patient with quick improvements.  MRI report shows Rt MCA distribution infarct.     PMH:  PAF, cholecystectomy, depression    Clinical Impression   PTA Pt independent in ADL and mobility. Pt back at baseline with only exception decreased sensation in the LLE. Pt able to bear weight and reach for high and low objects on LLE. Pt with no further OT needs. Pt and wife with no questions or concerns at the end of session. Education complete. OT to sign off, thank you for this referral.    Follow Up Recommendations  No OT follow up    Equipment Recommendations  None recommended by OT    Recommendations for Other Services       Precautions / Restrictions Precautions Precautions: None Restrictions Weight Bearing Restrictions: No      Mobility Bed Mobility Overal bed mobility: Modified Independent                Transfers Overall transfer level: Modified independent Equipment used: None                  Balance Overall balance assessment: Needs assistance         Standing balance support: No upper extremity supported Standing balance-Leahy Scale: Good                 High Level Balance Comments: Able to shift center of gravity onto LLE and reach high, low, and extend horizontally           ADL either performed or assessed with clinical judgement   ADL Overall ADL's : Independent                                             Vision Baseline Vision/History: Wears glasses Wears Glasses: At all times Patient Visual Report: No change from baseline Vision Assessment?: No apparent visual deficits     Perception     Praxis      Pertinent Vitals/Pain Pain  Assessment: Faces Faces Pain Scale: No hurt     Hand Dominance Right   Extremity/Trunk Assessment Upper Extremity Assessment Upper Extremity Assessment: Overall WFL for tasks assessed (no differences between RUE and LUE at this time)   Lower Extremity Assessment Lower Extremity Assessment: LLE deficits/detail LLE Deficits / Details: motor intact LLE Sensation: decreased light touch   Cervical / Trunk Assessment Cervical / Trunk Assessment: Normal   Communication Communication Communication: No difficulties   Cognition Arousal/Alertness: Awake/alert Behavior During Therapy: WFL for tasks assessed/performed Overall Cognitive Status: Within Functional Limits for tasks assessed                                     General Comments  Wife present for session    Exercises     Shoulder Instructions      Home Living Family/patient expects to be discharged to:: Private residence Living Arrangements: Spouse/significant other Available Help at Discharge: Family;Available 24 hours/day (Supervision assist only) Type of Home: House Home Access: Level entry     Home Layout: One level  Bathroom Shower/Tub: Chief Strategy Officer: Standard     Home Equipment: Cane - single point;Shower seat   Additional Comments: Wife is having trouble with Lt leg/hip and can provide supervision assist.      Prior Functioning/Environment Level of Independence: Independent        Comments: Patient worked in his garden, drives, loves working on his jeep        OT Problem List: Impaired UE functional use      OT Treatment/Interventions:      OT Goals(Current goals can be found in the care plan section) Acute Rehab OT Goals Patient Stated Goal: to get home OT Goal Formulation: With patient/family Time For Goal Achievement: 09/03/16 Potential to Achieve Goals: Good  OT Frequency:     Barriers to D/C:            Co-evaluation               AM-PAC PT "6 Clicks" Daily Activity     Outcome Measure Help from another person eating meals?: None Help from another person taking care of personal grooming?: None Help from another person toileting, which includes using toliet, bedpan, or urinal?: None Help from another person bathing (including washing, rinsing, drying)?: None Help from another person to put on and taking off regular upper body clothing?: None Help from another person to put on and taking off regular lower body clothing?: None 6 Click Score: 24   End of Session Equipment Utilized During Treatment: Gait belt Nurse Communication: Mobility status  Activity Tolerance: Patient tolerated treatment well Patient left: in bed;with call bell/phone within reach;with family/visitor present  OT Visit Diagnosis: Hemiplegia and hemiparesis;Other symptoms and signs involving the nervous system (R29.898) Hemiplegia - Right/Left: Left Hemiplegia - dominant/non-dominant: Non-Dominant Hemiplegia - caused by: Cerebral infarction                Time: 4098-1191 OT Time Calculation (min): 25 min Charges:  OT General Charges $OT Visit: 1 Procedure OT Evaluation $OT Eval Low Complexity: 1 Procedure OT Treatments $Self Care/Home Management : 8-22 mins G-Codes:     Sherryl Manges OTR/L [phone]  Robert Mendoza 08/20/2016, 11:31 AM

## 2016-08-20 NOTE — Progress Notes (Signed)
   Subjective: No acute events overnight. Patient states he feels at his baseline. No slurred speech or weakness. He is doing great. We plan for discharge to home today.  Objective:  Vital signs in last 24 hours: Vitals:   08/19/16 2120 08/19/16 2320 08/20/16 0107 08/20/16 0541  BP: (!) 120/56 130/75 123/62 130/76  Pulse: 62 60 65 61  Resp: 17 18 18 16   Temp: 99.1 F (37.3 C) 97.7 F (36.5 C) 98.9 F (37.2 C) 98.8 F (37.1 C)  TempSrc: Oral Oral Oral Oral  SpO2: 98% 98% 99% 96%  Weight:       Physical Exam  Constitutional: He is oriented to person, place, and time. He appears well-developed and well-nourished.  HENT:  Head: Normocephalic and atraumatic.  Cardiovascular: Normal rate and regular rhythm.  Exam reveals no friction rub.   No murmur heard. Respiratory: Effort normal and breath sounds normal.  Musculoskeletal: He exhibits no edema.  Neurological: He is alert and oriented to person, place, and time.     Assessment/Plan:  Principal Problem:   Arterial ischemic stroke, MCA, right, acute (HCC) Active Problems:   S/P cholecystectomy   Depression   Acute ischemic right MCA stroke (HCC)   Paroxysmal atrial fibrillation [facility](HCC)  The patient is an 83 year old male who presented with left-sided weakness found to have right MCA territory infarct.  # Right MCA territory infarct MRI demonstrating large right MCA infarct. This is most likely cardioembolic in nature secondary to untreated atrial fibrillation. MRA without significant stenosis. Carotid Dopplers without significant stenosis. Patient has been in normal sinus rhythm in the inpatient setting. Echocardiogram normal with left ventricular ejection fraction of 60-65%. Lipid panel normal. Hemoglobin A1c not resulted. Patient has no residual stroke deficits. Physical therapy with no recommendations. He is appropriate for discharge. At discharge we will send the patient home on aspirin. In 6 days on Tuesday, 08/26/2016 the  patient will discontinue aspirin and start Xarelto 20mg  daily. I think this is the most appropriate choice in the setting of large right MCA territory infarct that is most likely secondary to cardioembolic source in the setting of untreated atrial fibrillation. We will wait one week from the incident event to start anticoagulation secondary to hemorrhagic conversion risk. -- Discharge home -- Continue aspirin until 08/26/2016 then switch to Xarelto 20 mg daily  Dispo: Anticipated discharge today.  Thomasene Lotaylor, Jacquita Mulhearn, MD 08/20/2016, 11:17 AM Pager: [phone]

## 2016-08-20 NOTE — Progress Notes (Signed)
STROKE TEAM PROGRESS NOTE   SUBJECTIVE (INTERVAL HISTORY) Wife is at bedside. Pt has no complains and no acute event over night. CUS and TTE unremarkable. family ok with plan for Xarelto in 5 days and they would like to get medication from [facility].    OBJECTIVE Temp:  [97.7 F (36.5 C)-99.1 F (37.3 C)] 98.6 F (37 C) (06/06 1030) Pulse Rate:  [60-73] 64 (06/06 1030) Cardiac Rhythm: Heart block (06/06 0700) Resp:  [15-18] 16 (06/06 1030) BP: (120-161)/(56-85) 127/66 (06/06 1030) SpO2:  [96 %-100 %] 98 % (06/06 1030)  CBC:   Recent Labs Lab 08/19/16 0548 08/19/16 0553  WBC 8.6  --   NEUTROABS 4.9  --   HGB 14.3 14.6  HCT 42.7 43.0  MCV 92.2  --   PLT 208  --     Basic Metabolic Panel:   Recent Labs Lab 08/19/16 0548 08/19/16 0553  NA 138 141  K 4.0 3.9  CL 104 104  CO2 23  --   GLUCOSE 135* 132*  BUN 21* 23*  CREATININE 1.16 1.20  CALCIUM 8.7*  --     Lipid Panel:     Component Value Date/Time   CHOL 120 08/20/2016 0852   TRIG 68 08/20/2016 0852   HDL 31 (L) 08/20/2016 0852   CHOLHDL 3.9 08/20/2016 0852   VLDL 14 08/20/2016 0852   LDLCALC 75 08/20/2016 0852   HgbA1c: No results found for: HGBA1C Urine Drug Screen: No results found for: LABOPIA, COCAINSCRNUR, LABBENZ, AMPHETMU, THCU, LABBARB  Alcohol Level No results found for: [facility]  IMAGING I have personally reviewed the radiological images below and agree with the radiology interpretations.  Ct Head Code Stroke W/o Cm 08/19/2016 1. No acute intracranial infarct or other process identified. 2. ASPECTS is 10   Mri and Mra Brain Wo Contrast 08/19/2016 IMPRESSION: 1. Acute right MCA territory infarct with minimal cytotoxic edema and no hemorrhage or mass effect. 2. No associated large vessel occlusion or right MCA M2 stenosis but a focus of right M3 branch thrombus and occlusion is suspected. 3. Otherwise normal for age intracranial MRI and MRA.   TTE - Left ventricle: The cavity size was normal. Wall  thickness was   increased in a pattern of mild LVH. Systolic function was normal.   The estimated ejection fraction was in the range of 60% to 65%.   Wall motion was normal; there were no regional wall motion   abnormalities. Doppler parameters are consistent with abnormal   left ventricular relaxation (grade 1 diastolic dysfunction). - Aortic valve: Trileaflet; mildly thickened, mildly calcified   leaflets. - Aorta: Aortic root dimension: 40 mm (ED). - Ascending aorta: The ascending aorta was mildly dilated. - Mitral valve: Valve area by pressure half-time: 2.42 cm^2. - Left atrium: The atrium was mildly dilated. - Right ventricle: The cavity size was mildly dilated. Wall   thickness was normal. - Right atrium: The atrium was mildly dilated. - Tricuspid valve: There was trivial regurgitation. Impressions: - No cardiac source of emboli was indentified.  CUS - Bilateral: 1-39% ICA stenosis. Vertebral artery flow is antegrade.    PHYSICAL EXAM  Temp:  [97.7 F (36.5 C)-99.1 F (37.3 C)] 98.6 F (37 C) (06/06 1030) Pulse Rate:  [60-73] 64 (06/06 1030) Resp:  [15-18] 16 (06/06 1030) BP: (120-161)/(56-85) 127/66 (06/06 1030) SpO2:  [96 %-100 %] 98 % (06/06 1030)  General - Well nourished, well developed, in no apparent distress.  Ophthalmologic - Fundi not visualized due to  eye movement.  Cardiovascular - Regular rate and rhythm.  Mental Status -  Level of arousal and orientation to time, place, and person were intact. Language including expression, naming, repetition, comprehension was assessed and found intact.  Cranial Nerves II - XII - II - Visual field intact OU. III, IV, VI - Extraocular movements intact. V - Facial sensation intact bilaterally. VII - Facial movement intact bilaterally. VIII - Hearing & vestibular intact bilaterally. X - Palate elevates symmetrically. XI - Chin turning & shoulder shrug intact bilaterally. XII - Tongue protrusion intact.  Motor  Strength - The patient's strength was normal in all extremities and pronator drift was absent.  Bulk was normal and fasciculations were absent.   Motor Tone - Muscle tone was assessed at the neck and appendages and was normal.  Reflexes - The patient's reflexes were 1+ in all extremities and he had no pathological reflexes.  Sensory - Light touch, temperature/pinprick were symmetrical.    Coordination - The patient had normal movements in the hands with no ataxia or dysmetria.  Tremor was absent.  Gait and Station - deferred   ASSESSMENT/PLAN Mr. Farris Geiman is a 83 y.o. male with history of cholecystecomy and depression presenting with complaint of "numbness" unable to get OOB, difficulty speaking. He did not receive IV t-PA due to being out of the window.   Stroke:  right MCA infarct embolic most likely secondary to PAF  Resultant  Deficit resolved  CT head no acute finding. Aspects 10  MRI head right MCA infarct  MRA head likely right M3 occlusion  Carotid Doppler unremarkable  2D Echo  EF 60-65%  LDL 75  HgbA1c pending  Lovenox for VTE prophylaxis Diet Heart Room service appropriate? Yes; Fluid consistency: Thin Diet - low sodium heart healthy  aspirin 81 mg daily prior to admission, now on aspirin 325 mg daily. Recommend to consider Xarelto or pradaxa for stroke prevention in 5-7 days due to moderate sized infarct to avoid hemorrhagic transformation. Once Xarelto or pradaxa starts, ASA can be discontinued.  Patient counseled to be compliant with his antithrombotic medications  Ongoing aggressive stroke risk factor management  Therapy recommendations:  pending  Disposition:  Pending  PAF  Found in 10/2015 during cholecystectomy   Was on eliquis briefly but stopped due to presumed side effect of dizziness  Recommend to try Xarelto or pradaxa for stroke prevention in 5-7 days   Hyperlipidemia   LDL 75, goal < 70  Wife stated that pt not able to tolerate  lipitor in the past  Will start low dose pravastatin   Continue statin on discharge.  Other Stroke Risk Factors  Advanced age  ETOH 1 beer/month  Family Hx stroke (sister)  Other Active Problems  Depression  Hospital day # 1  Neurology will sign off. Please call with questions. Pt will follow up with Darrol Angel NP at [facility] in about 6 weeks. Thanks for the consult.   Marvel Plan, MD PhD Stroke Neurology 08/20/2016 11:42 AM   To contact Stroke Continuity provider, please refer to [email or web address]. After hours, contact General Neurology

## 2016-08-21 LAB — HEMOGLOBIN A1C
Hgb A1c MFr Bld: 5.7 % — ABNORMAL HIGH (ref 4.8–5.6)
Mean Plasma Glucose: 117 mg/dL

## 2018-11-08 IMAGING — CT CT HEAD CODE STROKE
3 series · 15 of 47 positions shown, 18 images · non-contrast
Comparison: None.

CLINICAL DATA: Code stroke. Initial evaluation for acute left-sided
weakness.

EXAM:
CT HEAD WITHOUT CONTRAST
TECHNIQUE: Contiguous axial images were obtained from the base of the skull
through the vertex without intravenous contrast.

[Series 3: head 5.0 st · axial · 0.43mm/px · z∈[-152,-2]mm · 9 of 36 slices shown, 12 images]
[im 3/36  brain]
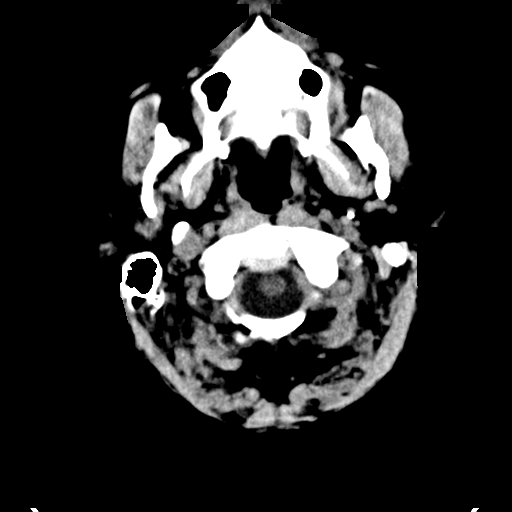
[im 3/36  bone]
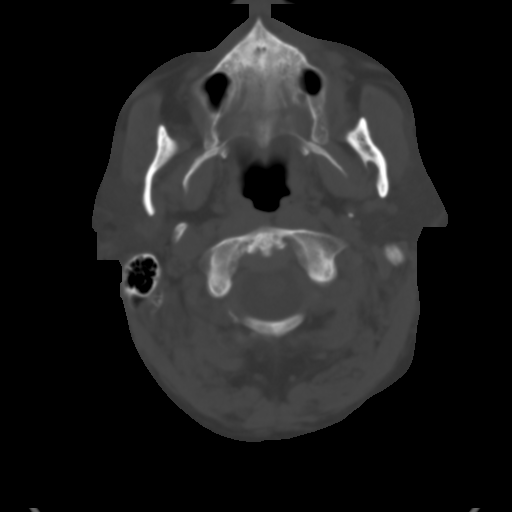
[im 7/36  brain]
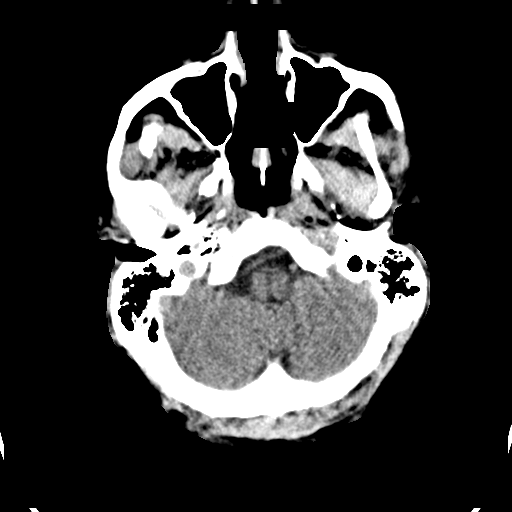
[im 10/36  brain]
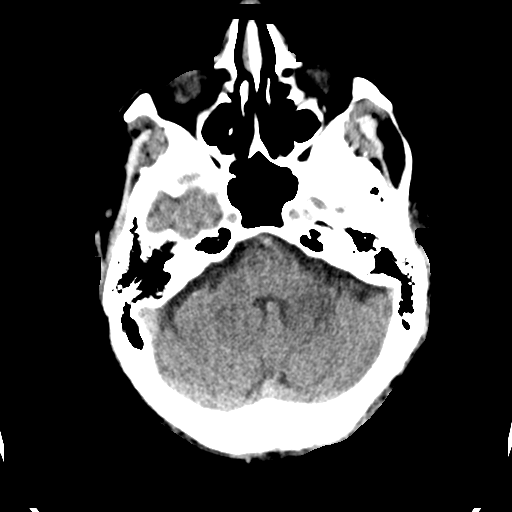
[im 14/36  brain]
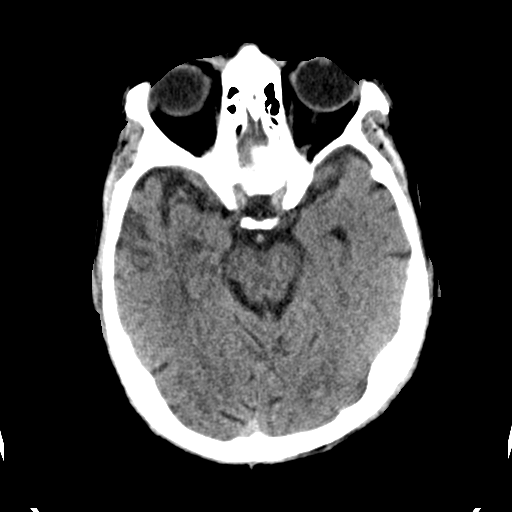
[im 19/36  brain]
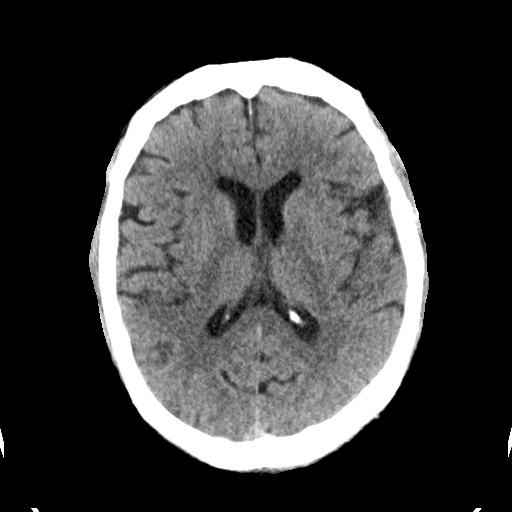
[im 19/36  bone]
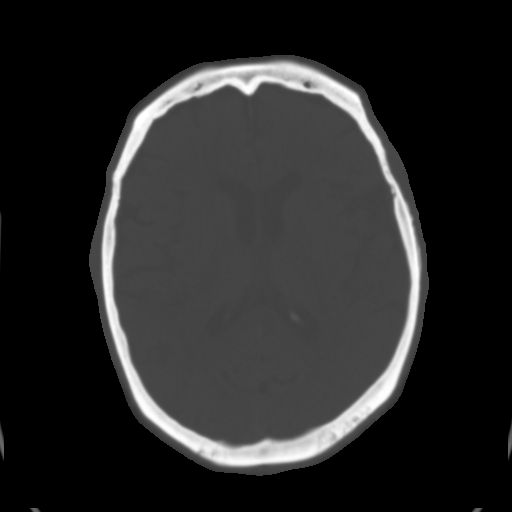
[im 22/36  brain]
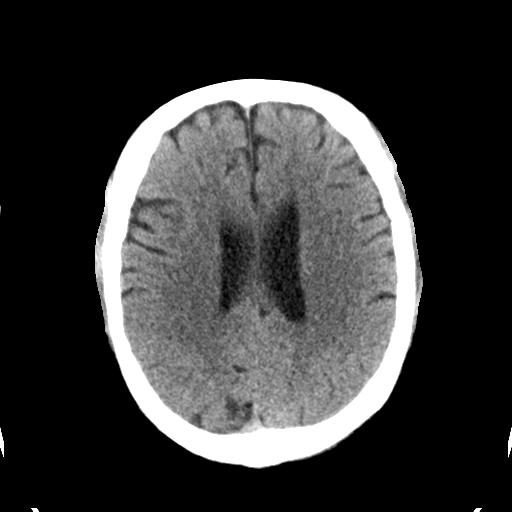
[im 26/36  brain]
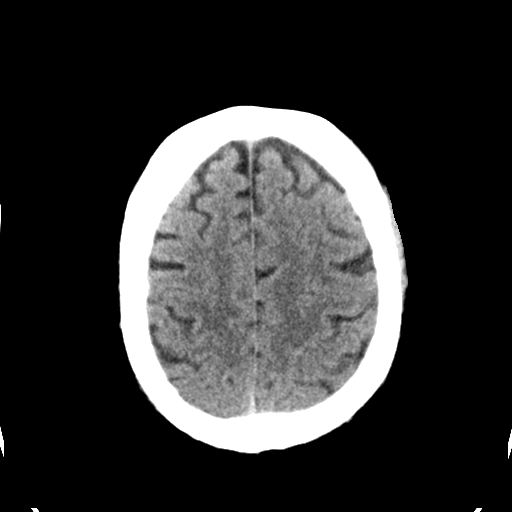
[im 29/36  brain]
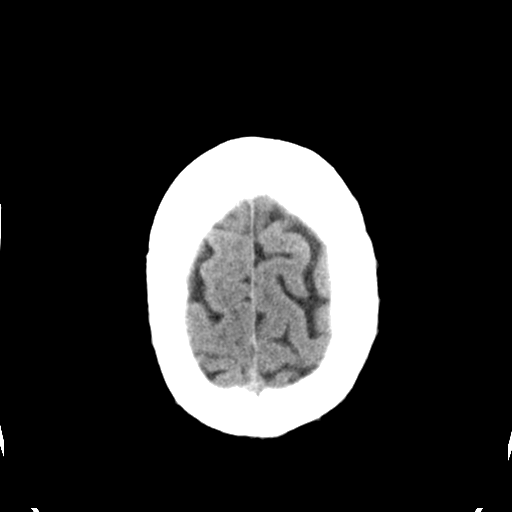
[im 33/36  brain]
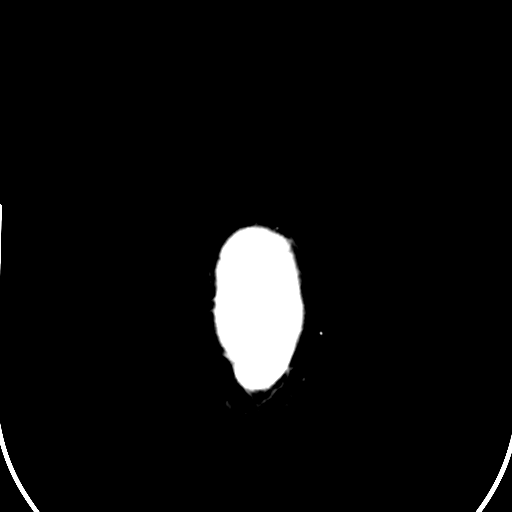
[im 33/36  bone]
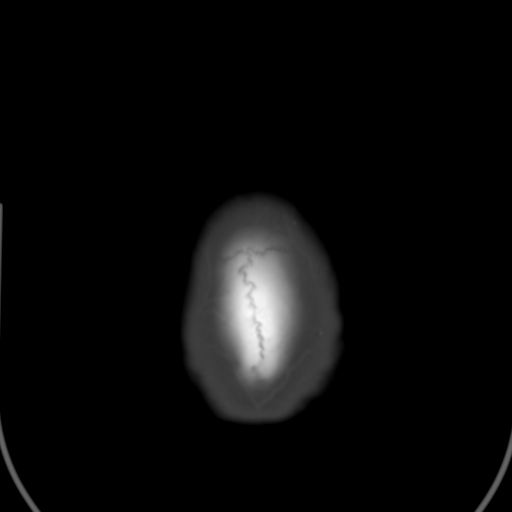

[Series 5: head 3.0 cor st · coronal · 0.33mm/px · 3 of 70 slices shown]
[im 24/70  brain]
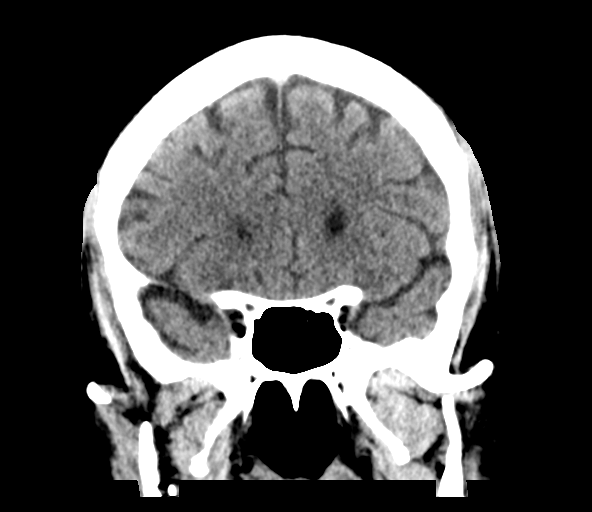
[im 31/70  brain]
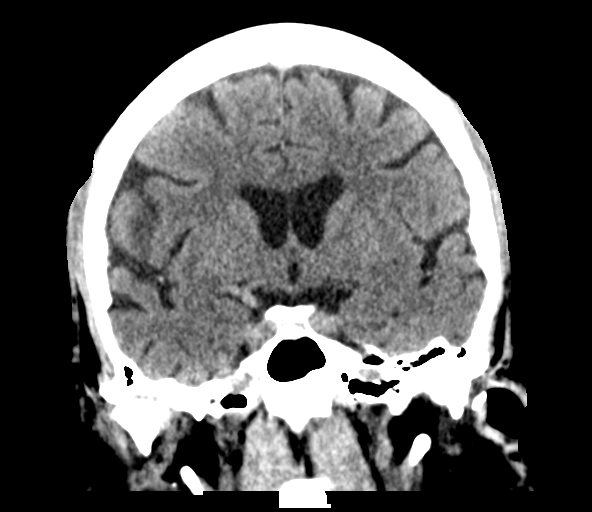
[im 39/70  brain]
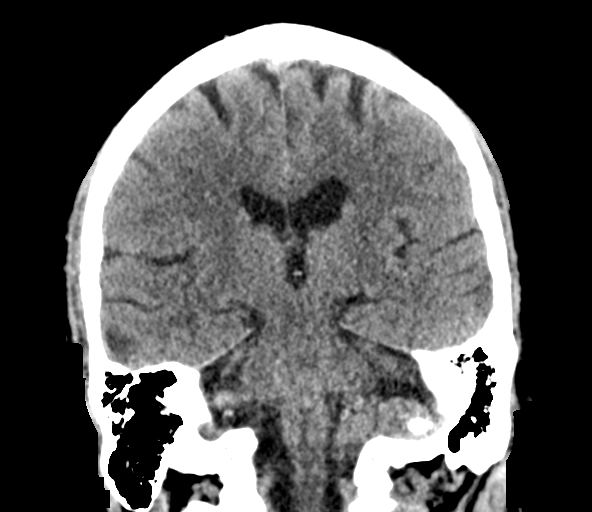

[Series 6: head 3.0 sag st · sagittal · 0.39mm/px · 3 of 64 slices shown]
[im 22/64  brain]
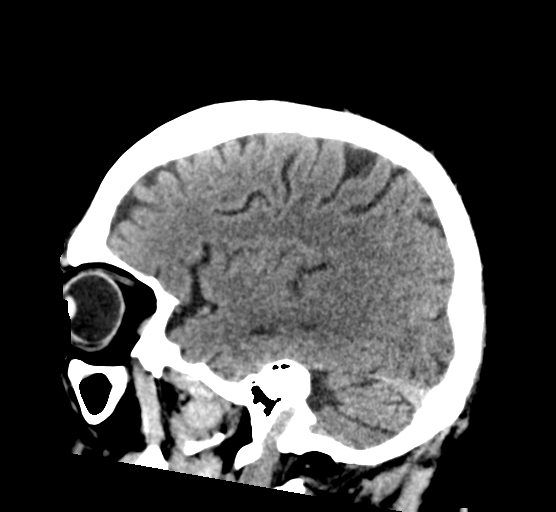
[im 32/64  brain]
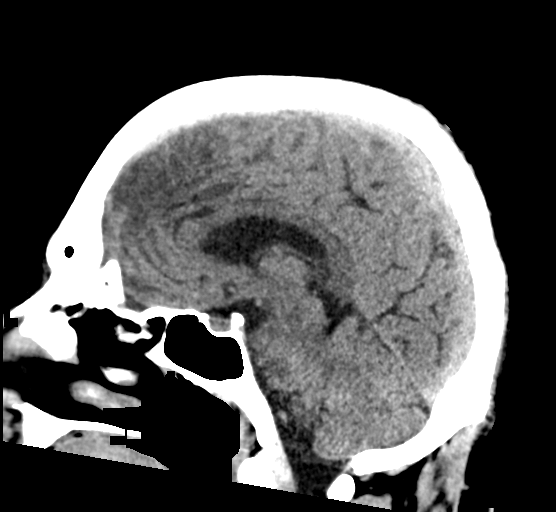
[im 43/64  brain]
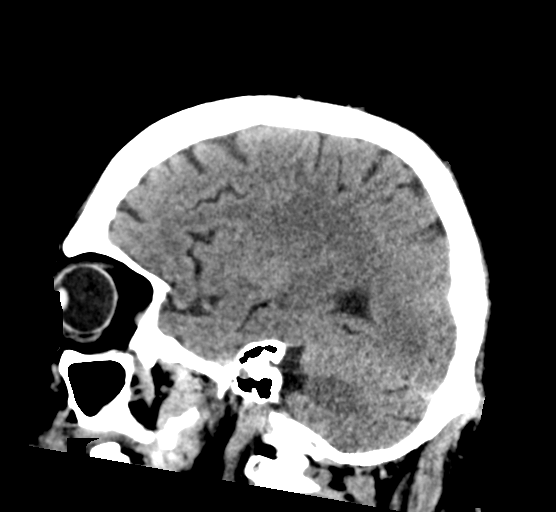

[15 of 47 positions shown; findings below may reference images not displayed]

FINDINGS: Brain: Mild age-related cerebral atrophy. No evidence for acute
intracranial hemorrhage. No findings to suggest acute large vessel
territory infarct. No mass lesion, midline shift or mass effect. No
hydrocephalus. No extra-axial fluid collection.

Vascular: Intracranial vascular somewhat diffusely hyperdense. No
asymmetric hyperdense vessel identified.

Skull: Scalp soft tissues and calvarium within normal limits.

Sinuses/Orbits: Globes and orbital soft tissues within normal
limits. Scattered mucosal thickening within the maxillary sinuses
and ethmoidal air cells bilaterally. Paranasal sinuses are otherwise
clear. No mastoid effusion.

ASPECTS (Alberta Stroke Program Early CT Score)

- Ganglionic level infarction (caudate, lentiform nuclei, internal
capsule, insula, M1-M3 cortex): 7

- Supraganglionic infarction (M4-M6 cortex): 3

Total score (0-10 with 10 being normal): 10
IMPRESSION: 1. No acute intracranial infarct or other process identified.
2. ASPECTS is 10
Critical Value/emergent results were called by telephone at the time
of interpretation on 08/19/2016 at [DATE] to Dr. Ba, who
verbally acknowledged these results.
